# Patient Record
Sex: Male | Born: 1975 | Race: Black or African American | Hispanic: No | Marital: Single | Smoking: Current every day smoker
Health system: Southern US, Community
[De-identification: ages and names within clinical notes are randomized; demographics above are authoritative.]

---

## 2007-06-19 ENCOUNTER — Emergency Department: Payer: Self-pay | Admitting: Emergency Medicine

## 2007-06-23 ENCOUNTER — Emergency Department: Payer: Self-pay

## 2007-06-23 ENCOUNTER — Other Ambulatory Visit: Payer: Self-pay

## 2013-04-13 ENCOUNTER — Emergency Department: Payer: Self-pay | Admitting: Emergency Medicine

## 2013-04-13 LAB — URINALYSIS, COMPLETE
Bilirubin,UR: NEGATIVE
Glucose,UR: NEGATIVE mg/dL (ref 0–75)
Nitrite: NEGATIVE
Ph: 6 (ref 4.5–8.0)
Protein: 25
Specific Gravity: 1.005 (ref 1.003–1.030)
Squamous Epithelial: <1
WBC UR: 1 /HPF (ref 0–5)

## 2013-04-13 LAB — COMPREHENSIVE METABOLIC PANEL
Alkaline Phosphatase: 94 U/L (ref 50–136)
Calcium, Total: 10.1 mg/dL (ref 8.5–10.1)
Chloride: 102 mmol/L (ref 98–107)
Creatinine: 0.91 mg/dL (ref 0.60–1.30)
EGFR (Non-African Amer.): >60
Glucose: 88 mg/dL (ref 65–99)
Osmolality: 271 (ref 275–301)
Potassium: 3.5 mmol/L (ref 3.5–5.1)
SGOT(AST): 37 U/L (ref 15–37)

## 2013-04-13 LAB — CBC
HGB: 15.9 g/dL (ref 13.0–18.0)
MCH: 27.8 pg (ref 26.0–34.0)
MCHC: 33.5 g/dL (ref 32.0–36.0)
RBC: 5.73 10*6/uL (ref 4.40–5.90)
RDW: 14.4 % (ref 11.5–14.5)

## 2016-05-26 ENCOUNTER — Emergency Department
Admission: EM | Admit: 2016-05-26 | Discharge: 2016-05-26 | Disposition: A | Discharge status: Home / Self Care | Payer: Self-pay | Attending: Emergency Medicine | Admitting: Emergency Medicine

## 2016-05-26 ENCOUNTER — Encounter: Payer: Self-pay | Admitting: Emergency Medicine

## 2016-05-26 DIAGNOSIS — Z5321 Procedure and treatment not carried out due to patient leaving prior to being seen by health care provider: Secondary | ICD-10-CM | POA: Insufficient documentation

## 2016-05-26 DIAGNOSIS — R1310 Dysphagia, unspecified: Secondary | ICD-10-CM | POA: Insufficient documentation

## 2016-05-26 NOTE — ED Triage Notes (Signed)
Pt with difficulty swallowing liquids for three days, states liquids come back up. Ok to swallow solid foods. No resp distress noted.

## 2016-05-27 ENCOUNTER — Telehealth: Payer: Self-pay | Admitting: Emergency Medicine

## 2016-05-27 NOTE — Telephone Encounter (Signed)
Called patient due to lwot to inquire about condition and follow up plans. Patient says he does not have any stroke symptoms, but does have pain with swallowing and feels like throat is raw. He says it sometimes feels like acid coming up.  He has no pcp, but will be getting insurance in about 30 days. I advised him to obtain a pcp in his network and make his appointment.  I also advised him to ask a pharmacist about any otc medications they would recommend for his symptoms. I told him he could always return here if he desires or if worse.

## 2016-06-18 ENCOUNTER — Emergency Department
Admission: EM | Admit: 2016-06-18 | Discharge: 2016-06-18 | Disposition: A | Discharge status: Home / Self Care | Payer: Self-pay | Attending: Emergency Medicine | Admitting: Emergency Medicine

## 2016-06-18 ENCOUNTER — Encounter: Payer: Self-pay | Admitting: Emergency Medicine

## 2016-06-18 DIAGNOSIS — R1110 Vomiting, unspecified: Secondary | ICD-10-CM

## 2016-06-18 DIAGNOSIS — J029 Acute pharyngitis, unspecified: Secondary | ICD-10-CM | POA: Insufficient documentation

## 2016-06-18 DIAGNOSIS — F17200 Nicotine dependence, unspecified, uncomplicated: Secondary | ICD-10-CM | POA: Insufficient documentation

## 2016-06-18 LAB — CBC
HCT: 46.7 % (ref 40.0–52.0)
HEMOGLOBIN: 15.8 g/dL (ref 13.0–18.0)
MCH: 28.7 pg (ref 26.0–34.0)
MCHC: 33.8 g/dL (ref 32.0–36.0)
MCV: 84.9 fL (ref 80.0–100.0)
PLATELETS: 175 10*3/uL (ref 150–440)
RBC: 5.5 MIL/uL (ref 4.40–5.90)
RDW: 14.3 % (ref 11.5–14.5)
WBC: 11.4 10*3/uL — ABNORMAL HIGH (ref 3.8–10.6)

## 2016-06-18 LAB — POCT RAPID STREP A: STREPTOCOCCUS, GROUP A SCREEN (DIRECT): NEGATIVE

## 2016-06-18 LAB — COMPREHENSIVE METABOLIC PANEL
ALK PHOS: 64 U/L (ref 38–126)
ALT: 32 U/L (ref 17–63)
ANION GAP: 6 (ref 5–15)
AST: 32 U/L (ref 15–41)
Albumin: 4.5 g/dL (ref 3.5–5.0)
BILIRUBIN TOTAL: 0.6 mg/dL (ref 0.3–1.2)
BUN: 10 mg/dL (ref 6–20)
CALCIUM: 9.9 mg/dL (ref 8.9–10.3)
CO2: 26 mmol/L (ref 22–32)
Chloride: 110 mmol/L (ref 101–111)
Creatinine, Ser: 0.79 mg/dL (ref 0.61–1.24)
GFR calc non Af Amer: >60 mL/min (ref >60)
Glucose, Bld: 90 mg/dL (ref 65–99)
Potassium: 3.4 mmol/L — ABNORMAL LOW (ref 3.5–5.1)
Sodium: 142 mmol/L (ref 135–145)
TOTAL PROTEIN: 7.5 g/dL (ref 6.5–8.1)

## 2016-06-18 LAB — URINALYSIS COMPLETE WITH MICROSCOPIC (ARMC ONLY)
Bacteria, UA: NONE SEEN
Bilirubin Urine: NEGATIVE
GLUCOSE, UA: NEGATIVE mg/dL
KETONES UR: NEGATIVE mg/dL
Leukocytes, UA: NEGATIVE
NITRITE: NEGATIVE
Protein, ur: NEGATIVE mg/dL
SPECIFIC GRAVITY, URINE: 1.003 — AB (ref 1.005–1.030)
Squamous Epithelial / LPF: NONE SEEN
WBC UA: NONE SEEN WBC/hpf (ref 0–5)
pH: 8 (ref 5.0–8.0)

## 2016-06-18 LAB — LIPASE, BLOOD: Lipase: 24 U/L (ref 11–51)

## 2016-06-18 MED ORDER — DEXAMETHASONE 1 MG/ML PO CONC
ORAL | Status: AC
  Administered 2016-06-18: 10 mg via ORAL
  Filled 2016-06-18: qty 1

## 2016-06-18 MED ORDER — ONDANSETRON HCL 4 MG PO TABS: 4.0000 mg | ORAL_TABLET | Freq: Three times a day (TID) | ORAL | 0 refills | Status: DC | PRN

## 2016-06-18 MED ORDER — ONDANSETRON HCL 4 MG/2ML IJ SOLN
4.0000 mg | Freq: Once | INTRAMUSCULAR | Status: AC | PRN
  Administered 2016-06-18: 4 mg via INTRAVENOUS

## 2016-06-18 MED ORDER — ONDANSETRON HCL 4 MG/2ML IJ SOLN
INTRAMUSCULAR | Status: AC
  Administered 2016-06-18: 4 mg via INTRAVENOUS
  Filled 2016-06-18: qty 2

## 2016-06-18 MED ORDER — DEXAMETHASONE 1 MG/ML PO CONC
10.0000 mg | Freq: Once | ORAL | Status: AC
  Administered 2016-06-18: 10 mg via ORAL

## 2016-06-18 NOTE — Discharge Instructions (Signed)
You were evaluated for sore throat as well as nausea and vomiting, and although no certain cause was found, I suspect may be a virus causing your symptoms. You are given a dose of Decadron to help with inflammation and symptoms.  We discussed holding off on any CT scans at this point in time, but you do need to return to the emergency department at any point in time if you develop any trouble breathing, trouble swallowing, fever, vomiting blood, or any other symptoms concerning to you.  You are referred [facility] or [facility] for [facility] care.

## 2016-06-18 NOTE — ED Notes (Signed)

## 2016-06-18 NOTE — ED Triage Notes (Signed)
Pt here for throat pain on and off x3 weeks, vomiting ( activity vomiting) pt diaphoretic

## 2016-06-18 NOTE — ED Provider Notes (Signed)
[facility] Emergency Department Provider Note ____________________________________________   I have reviewed the triage vital signs and the triage nursing note.  HISTORY  Chief Complaint Sore Throat and Emesis   Historian Patient  HPI Bobby Walker is a 40 y.o. male who is here for evaluation of sore throat for about 5 days now. He states it was mild and now is worse. He states that occasionally he feels like he is having trouble breathing or trouble swallowing, but no fever, vomiting blood although he has had some nausea and vomiting.No chest pain.  No abdominal pain or diarrhea. He states that when he looked at his tongue he saw bumps back there.  One week ago he was treated with pills for Trichomonas found it sounds like before he was going to donate blood. He doesn't feel like he was having any symptoms before that and doesn't feel like this is a side effect of the antibiotics.    History reviewed. No pertinent past medical history.  There are no active problems to display for this patient.   History reviewed. No pertinent surgical history.  Prior to Admission medications   Medication Sig Start Date End Date Taking? Authorizing Provider  ondansetron (ZOFRAN) 4 MG tablet Take 1 tablet (4 mg total) by mouth every 8 (eight) hours as needed for nausea or vomiting. 06/18/16   Governor Rooks, MD    No Known Allergies  No family history on file.  Social History Social History  Substance Use Topics  . Smoking status: Current Every Day Smoker  . Smokeless tobacco: Never Used  . Alcohol use Not on file    Review of Systems  Constitutional: Negative for fever. Eyes: Negative for visual changes. ENT: Positive for sore throat. Cardiovascular: Negative for chest pain. Respiratory: Negative for productive cough. Gastrointestinal: Negative for abdominal pain, vomiting and diarrhea. Genitourinary: Negative for dysuria. Musculoskeletal: Negative for back  pain. Skin: Negative for rash. Neurological: Negative for headache. 10 point Review of Systems otherwise negative ____________________________________________   PHYSICAL EXAM:  VITAL SIGNS: ED Triage Vitals [06/18/16 0835]  Enc Vitals Group     BP 120/66     Pulse Rate 63     Resp 20     Temp 97.7 F (36.5 C)     Temp Source Oral     SpO2 98 %     Weight 155 lb (70.3 kg)     Height 5\' 1"  (1.549 m)     Head Circumference      Peak Flow      Pain Score 9     Pain Loc      Pain Edu?      Excl. in GC?      Constitutional: Alert and oriented. Well appearing and in no distress. HEENT   Head: Normocephalic and atraumatic.      Eyes: Conjunctivae are normal. PERRL. Normal extraocular movements.      Ears:         Nose: No congestion/rhinnorhea.   Mouth/Throat: Mucous membranes are moist.  Mild pharyngeal erythema without any exudates. No uvular swelling. No palatal petechiae.   Neck: No stridor.  No enlarged lymph nodes. No visible swelling. No stridor. Cardiovascular/Chest: Normal rate, regular rhythm.  No murmurs, rubs, or gallops. Respiratory: Normal respiratory effort without tachypnea nor retractions. Breath sounds are clear and equal bilaterally. No wheezes/rales/rhonchi. Gastrointestinal: Soft. No distention, no guarding, no rebound. Nontender.    Genitourinary/rectal:Deferred Musculoskeletal: Nontender with normal range of motion in all  extremities. No joint effusions.  No lower extremity tenderness.  No edema. Neurologic:  Normal speech and language. No gross or focal neurologic deficits are appreciated. Skin:  Skin is warm, dry and intact. No rash noted. Psychiatric: Mood and affect are normal. Speech and behavior are normal. Patient exhibits appropriate insight and judgment.   ____________________________________________  LABS (pertinent positives/negatives)  Labs Reviewed  COMPREHENSIVE METABOLIC PANEL - Abnormal; Notable for the following:        Result Value   Potassium 3.4 (*)    All other components within normal limits  CBC - Abnormal; Notable for the following:    WBC 11.4 (*)    All other components within normal limits  URINALYSIS COMPLETEWITH MICROSCOPIC ([facility] ONLY) - Abnormal; Notable for the following:    Color, Urine STRAW (*)    APPearance CLEAR (*)    Specific Gravity, Urine 1.003 (*)    Hgb urine dipstick 1+ (*)    All other components within normal limits  CULTURE, GROUP A STREP ([facility])  LIPASE, BLOOD  POCT RAPID STREP A    ____________________________________________    EKG I, Governor Rooksebecca Vicente Weidler, MD, the attending physician have personally viewed and interpreted all ECGs.  59 bpm. Normal sinus rhythm. Narrow QRS. Nonspecific ST and T-wave.  Looks fairly similar to 2008 ____________________________________________  RADIOLOGY All Xrays were viewed by me. Imaging interpreted by Radiologist.  None __________________________________________  PROCEDURES  Procedure(s) performed: None  Critical Care performed: None  ____________________________________________   ED COURSE / ASSESSMENT AND PLAN  Pertinent labs & imaging results that were available during my care of the patient were reviewed by me and considered in my medical decision making (see chart for details).   Bobby Walker is here for sore throat and stating that he has bumps on his tongue near the back. When looking at his oropharynx, there is minimal erythema, and the bumps that he is talking about appeared to be the posterior taste buds.  No petechiae or vesicles. Normal airway on exam, no wheezing or trouble breathing or stridor. Neck is soft and nontender without adenopathy. At this point in time I don't have a high suspicion for abscess or epiglottitis or mass or Ludwig's angina. His main complaint is the sore throat, and I think it may be viral pharyngitis and we discussed the child Decadron. I also discussed with him whether or not to CT the  neck, but given reassuring exam and evaluation, we chose to hold off on this right now which I think is totally reasonable.   Nausea and vomiting sound like it might be related to swallowing which is painful, but there is no abdominal pain on exam and I don't see obvious indication of an intra-abdominal emergency. I am going to prescribe a couple of Zofran tablets. Most importantly, I want him to be closely followed, and referred to primary care for follow-up, and we discussed return precautions for the emergency department.   CONSULTATIONS:   None   Patient / Family / Caregiver informed of clinical course, medical decision-making process, and agree with plan.   I discussed return precautions, follow-up instructions, and discharge instructions with patient and/or family.   ___________________________________________   FINAL CLINICAL IMPRESSION(S) / ED DIAGNOSES   Final diagnoses:  Non-intractable vomiting, presence of nausea not specified, unspecified vomiting type  Sore throat              Note: This dictation was prepared with Dragon dictation. Any transcriptional errors that result from this process  are unintentional    Governor Rooksebecca Milly Goggins, MD 06/18/16 [phone]

## 2016-06-21 LAB — CULTURE, GROUP A STREP (THRC)

## 2018-04-28 ENCOUNTER — Emergency Department (HOSPITAL_COMMUNITY)
Admission: EM | Admit: 2018-04-28 | Discharge: 2018-04-28 | Disposition: A | Discharge status: Home / Self Care | Payer: PRIVATE HEALTH INSURANCE | Attending: Emergency Medicine | Admitting: Emergency Medicine

## 2018-04-28 ENCOUNTER — Emergency Department (HOSPITAL_COMMUNITY): Payer: PRIVATE HEALTH INSURANCE

## 2018-04-28 ENCOUNTER — Other Ambulatory Visit: Payer: Self-pay

## 2018-04-28 ENCOUNTER — Encounter (HOSPITAL_COMMUNITY): Payer: Self-pay | Admitting: Emergency Medicine

## 2018-04-28 DIAGNOSIS — Z79899 Other long term (current) drug therapy: Secondary | ICD-10-CM | POA: Diagnosis not present

## 2018-04-28 DIAGNOSIS — R079 Chest pain, unspecified: Secondary | ICD-10-CM | POA: Insufficient documentation

## 2018-04-28 DIAGNOSIS — F17200 Nicotine dependence, unspecified, uncomplicated: Secondary | ICD-10-CM | POA: Diagnosis not present

## 2018-04-28 LAB — COMPREHENSIVE METABOLIC PANEL
ALBUMIN: 4.9 g/dL (ref 3.5–5.0)
ALT: 42 U/L (ref 0–44)
ANION GAP: 17 — AB (ref 5–15)
AST: 82 U/L — ABNORMAL HIGH (ref 15–41)
Alkaline Phosphatase: 65 U/L (ref 38–126)
BILIRUBIN TOTAL: 2 mg/dL — AB (ref 0.3–1.2)
BUN: 28 mg/dL — ABNORMAL HIGH (ref 6–20)
CHLORIDE: 100 mmol/L (ref 98–111)
CO2: 17 mmol/L — AB (ref 22–32)
Calcium: 10.1 mg/dL (ref 8.9–10.3)
Creatinine, Ser: 1.37 mg/dL — ABNORMAL HIGH (ref 0.61–1.24)
GFR calc Af Amer: >60 mL/min (ref >60)
GFR calc non Af Amer: >60 mL/min (ref >60)
GLUCOSE: 93 mg/dL (ref 70–99)
POTASSIUM: 3.9 mmol/L (ref 3.5–5.1)
SODIUM: 134 mmol/L — AB (ref 135–145)
TOTAL PROTEIN: 7.6 g/dL (ref 6.5–8.1)

## 2018-04-28 LAB — CBC WITH DIFFERENTIAL/PLATELET
Abs Immature Granulocytes: 0.1 10*3/uL (ref 0.0–0.1)
BASOS ABS: 0.1 10*3/uL (ref 0.0–0.1)
BASOS PCT: 0 %
EOS ABS: 0.1 10*3/uL (ref 0.0–0.7)
EOS PCT: 1 %
HCT: 44.2 % (ref 39.0–52.0)
Hemoglobin: 15.3 g/dL (ref 13.0–17.0)
Immature Granulocytes: 0 %
Lymphocytes Relative: 23 %
Lymphs Abs: 3.3 10*3/uL (ref 0.7–4.0)
MCH: 28.6 pg (ref 26.0–34.0)
MCHC: 34.6 g/dL (ref 30.0–36.0)
MCV: 82.6 fL (ref 78.0–100.0)
Monocytes Absolute: 1.5 10*3/uL — ABNORMAL HIGH (ref 0.1–1.0)
Monocytes Relative: 11 %
NEUTROS PCT: 65 %
Neutro Abs: 9.1 10*3/uL — ABNORMAL HIGH (ref 1.7–7.7)
PLATELETS: 163 10*3/uL (ref 150–400)
RBC: 5.35 MIL/uL (ref 4.22–5.81)
RDW: 13.2 % (ref 11.5–15.5)
WBC: 14.1 10*3/uL — AB (ref 4.0–10.5)

## 2018-04-28 LAB — LIPASE, BLOOD: LIPASE: 26 U/L (ref 11–51)

## 2018-04-28 LAB — I-STAT TROPONIN, ED
TROPONIN I, POC: 0.01 ng/mL (ref 0.00–0.08)
Troponin i, poc: 0 ng/mL (ref 0.00–0.08)

## 2018-04-28 LAB — D-DIMER, QUANTITATIVE (NOT AT ARMC): D DIMER QUANT: 0.29 ug{FEU}/mL (ref 0.00–0.50)

## 2018-04-28 LAB — CBG MONITORING, ED: Glucose-Capillary: 92 mg/dL (ref 70–99)

## 2018-04-28 MED ORDER — SODIUM CHLORIDE 0.9 % IV BOLUS
1000.0000 mL | Freq: Once | INTRAVENOUS | Status: AC
  Administered 2018-04-28: 1000 mL via INTRAVENOUS

## 2018-04-28 MED ORDER — ONDANSETRON HCL 4 MG/2ML IJ SOLN
4.0000 mg | Freq: Once | INTRAMUSCULAR | Status: AC
  Administered 2018-04-28: 4 mg via INTRAVENOUS
  Filled 2018-04-28: qty 2

## 2018-04-28 MED ORDER — ONDANSETRON HCL 4 MG PO TABS: 4.0000 mg | ORAL_TABLET | Freq: Four times a day (QID) | ORAL | 0 refills | Status: DC

## 2018-04-28 MED ORDER — LORAZEPAM 2 MG/ML IJ SOLN
0.5000 mg | Freq: Once | INTRAMUSCULAR | Status: AC
  Administered 2018-04-28: 0.5 mg via INTRAVENOUS
  Filled 2018-04-28: qty 1

## 2018-04-28 MED ORDER — FENTANYL CITRATE (PF) 100 MCG/2ML IJ SOLN
50.0000 ug | Freq: Once | INTRAMUSCULAR | Status: AC
  Administered 2018-04-28: 50 ug via INTRAVENOUS
  Filled 2018-04-28: qty 2

## 2018-04-28 NOTE — ED Triage Notes (Signed)
Patient present to the ED with complaints of CP since last night with a syncope espisode. Per EMS patient was hypotensive BP 90 sistolic. And diaphoresis . Patient alert and orinted x4 on arrival. EMS gave 324 ASA 4mg  zofran.

## 2018-04-28 NOTE — ED Notes (Signed)
EKG contained a significant amount of artifact due to patient shaking and not being able to sit still. Will repeat EKG once patient has calmed down.

## 2018-04-28 NOTE — Discharge Instructions (Addendum)
Tests showed no evidence of a heart attack.  Prescription for nausea medicine.  Increase fluids.  Rest.

## 2018-04-28 NOTE — ED Provider Notes (Signed)
[facility] EMERGENCY DEPARTMENT Provider Note   CSN: [id number] Arrival date & time: 04/28/18  [id number]     History   Chief Complaint Chief Complaint  Patient presents with  . Chest Pain    HPI Bobby Walker is a 42 y.o. male.  Squeezing chest pain earlier today since 5:30 PM with associated diaphoresis and nausea.  No dyspnea.  No prior history of CAD.  No diabetes or hypertension.  He quit smoking 3 months ago.  No chronic health problems.  No prolonged travel or immobilization.  Questionable syncopal episode last evening.     History reviewed. No pertinent past medical history.  There are no active problems to display for this patient.   History reviewed. No pertinent surgical history.      Home Medications    Prior to Admission medications   Medication Sig Start Date End Date Taking? Authorizing Provider  Vitamin D, Ergocalciferol, (DRISDOL) 50000 units CAPS capsule Take 1 capsule by mouth every Friday. 04/08/18  Yes [provider]  ondansetron (ZOFRAN) 4 MG tablet Take 1 tablet (4 mg total) by mouth every 8 (eight) hours as needed for nausea or vomiting. Patient not taking: Reported on 04/28/2018 06/18/16   Governor RooksLord, Rebecca, MD  ondansetron (ZOFRAN) 4 MG tablet Take 1 tablet (4 mg total) by mouth every 6 (six) hours. 04/28/18   Donnetta Hutchingook, Ota Ebersole, MD    Family History No family history on file.  Social History Social History   Tobacco Use  . Smoking status: Current Every Day Smoker  . Smokeless tobacco: Never Used  Substance Use Topics  . Alcohol use: Not on file  . Drug use: Not on file     Allergies   Patient has no known allergies.   Review of Systems Review of Systems  All other systems reviewed and are negative.    Physical Exam Updated Vital Signs BP (!) 104/41   Pulse 65   Temp 98.6 F (37 C) (Oral)   Resp 12   Ht 5\' 1"  (1.549 m)   Wt 69.4 kg   SpO2 100%   BMI 28.91 kg/m   Physical Exam  Constitutional: He is  oriented to person, place, and time. He appears well-developed and well-nourished.  HENT:  Head: Normocephalic and atraumatic.  Eyes: Conjunctivae are normal.  Neck: Neck supple.  Cardiovascular: Normal rate and regular rhythm.  Pulmonary/Chest: Effort normal and breath sounds normal.  Abdominal: Soft. Bowel sounds are normal.  Musculoskeletal: Normal range of motion.  Neurological: He is alert and oriented to person, place, and time.  Skin: Skin is warm and dry.  Psychiatric: He has a normal mood and affect. His behavior is normal.  Nursing note and vitals reviewed.    ED Treatments / Results  Labs (all labs ordered are listed, but only abnormal results are displayed) Labs Reviewed  CBC WITH DIFFERENTIAL/PLATELET - Abnormal; Notable for the following components:      Result Value   WBC 14.1 (*)    Neutro Abs 9.1 (*)    Monocytes Absolute 1.5 (*)    All other components within normal limits  COMPREHENSIVE METABOLIC PANEL - Abnormal; Notable for the following components:   Sodium 134 (*)    CO2 17 (*)    BUN 28 (*)    Creatinine, Ser 1.37 (*)    AST 82 (*)    Total Bilirubin 2.0 (*)    Anion gap 17 (*)    All other components within normal limits  D-DIMER, QUANTITATIVE (NOT AT [facility])  LIPASE, BLOOD  I-STAT TROPONIN, ED  CBG MONITORING, ED  I-STAT TROPONIN, ED    EKG EKG Interpretation  Date/Time:  Thursday April 28 2018 09:20:22 EDT Ventricular Rate:  64 PR Interval:    QRS Duration: 102 QT Interval:  421 QTC Calculation: 435 R Axis:   88 Text Interpretation:  Normal sinus rhythm Probable left ventricular hypertrophy ST elevation, consider anterior injury Artifact in lead(s) I II III aVR aVL V1 V2 Reconfirmed by Donnetta Hutching ([id number]) on 04/28/2018 2:04:23 PM   Radiology Dg Chest Port 1 View  Result Date: 04/28/2018 CLINICAL DATA:  Chest pain EXAM: PORTABLE CHEST 1 VIEW COMPARISON:  None. FINDINGS: Top-normal heart size. Normal mediastinal contour. No  pneumothorax. No pleural effusion. Lungs appear clear, with no acute consolidative airspace disease and no pulmonary edema. IMPRESSION: No active disease. Electronically Signed   By: Delbert Phenix M.D.   On: 04/28/2018 09:53    Procedures Procedures (including critical care time)  Medications Ordered in ED Medications  sodium chloride 0.9 % bolus 1,000 mL (0 mLs Intravenous Stopped 04/28/18 1239)  ondansetron (ZOFRAN) injection 4 mg (4 mg Intravenous Given 04/28/18 0932)  fentaNYL (SUBLIMAZE) injection 50 mcg (50 mcg Intravenous Given 04/28/18 0933)  LORazepam (ATIVAN) injection 0.5 mg (0.5 mg Intravenous Given 04/28/18 0934)  sodium chloride 0.9 % bolus 1,000 mL (0 mLs Intravenous Stopped 04/28/18 1402)     Initial Impression / Assessment and Plan / ED Course  I have reviewed the triage vital signs and the nursing notes.  Pertinent labs & imaging results that were available during my care of the patient were reviewed by me and considered in my medical decision making (see chart for details).    Patient presents with chest pain.  He is low risk for ACS or PE.  Screening tests including EKG, chest x-ray, delta troponin, d-dimer all negative.  Patient observed for several hours.  He was hemodynamically stable at discharge.  Discussed test results with patient and his wife.  Final Clinical Impressions(s) / ED Diagnoses   Final diagnoses:  Chest pain, unspecified type    ED Discharge Orders         Ordered    ondansetron (ZOFRAN) 4 MG tablet  Every 6 hours     04/28/18 1355           Donnetta Hutching, MD 04/28/18 1409

## 2018-09-23 ENCOUNTER — Emergency Department: Payer: PRIVATE HEALTH INSURANCE

## 2018-09-23 ENCOUNTER — Encounter: Payer: Self-pay | Admitting: Medical Oncology

## 2018-09-23 ENCOUNTER — Emergency Department
Admission: EM | Admit: 2018-09-23 | Discharge: 2018-09-23 | Disposition: A | Discharge status: Home / Self Care | Payer: PRIVATE HEALTH INSURANCE | Attending: Emergency Medicine | Admitting: Emergency Medicine

## 2018-09-23 DIAGNOSIS — M25531 Pain in right wrist: Secondary | ICD-10-CM | POA: Diagnosis not present

## 2018-09-23 DIAGNOSIS — F17210 Nicotine dependence, cigarettes, uncomplicated: Secondary | ICD-10-CM | POA: Insufficient documentation

## 2018-09-23 MED ORDER — IBUPROFEN 600 MG PO TABS: 600.0000 mg | ORAL_TABLET | Freq: Three times a day (TID) | ORAL | 0 refills | Status: DC | PRN

## 2018-09-23 MED ORDER — OXYCODONE-ACETAMINOPHEN 5-325 MG PO TABS
1.0000 | ORAL_TABLET | Freq: Once | ORAL | Status: AC
  Administered 2018-09-23: 1 via ORAL
  Filled 2018-09-23: qty 1

## 2018-09-23 MED ORDER — IBUPROFEN 600 MG PO TABS
600.0000 mg | ORAL_TABLET | Freq: Once | ORAL | Status: AC
  Administered 2018-09-23: 600 mg via ORAL
  Filled 2018-09-23: qty 1

## 2018-09-23 MED ORDER — TRAMADOL HCL 50 MG PO TABS: 50.0000 mg | ORAL_TABLET | Freq: Four times a day (QID) | ORAL | 0 refills | Status: DC | PRN

## 2018-09-23 NOTE — ED Notes (Signed)
Pt states that while moving a refridgerator by himself, it fell off the dolly and pinned his right arm in between a wall. Pt was stuck there for 45 minutes until help could come and get the fridge off of him.

## 2018-09-23 NOTE — ED Triage Notes (Signed)
Pt reports he was moving a refrigerator yesterday and his rt lower arm got pinned between door and fridge.

## 2018-09-23 NOTE — Discharge Instructions (Addendum)
Wear splint and sling for 2 to 3 days as needed.

## 2018-09-23 NOTE — ED Provider Notes (Signed)
[facility] Emergency Department Provider Note   ____________________________________________   First MD Initiated Contact with Patient 09/23/18 [phone]     (approximate)  I have reviewed the triage vital signs and the nursing notes.   HISTORY  Chief Complaint Arm Pain    HPI Bobby Walker is a 43 y.o. male right wrist and forearm pain secondary to contusion.  Patient was moving a refrigerator yesterday when his arm got caught between the door and the refrigerator.  Patient states swelling has increased along with the pain overnight.  Patient rates his pain as a 9/10.  Patient described the pain is "aching".  Patient states decreased range of motion with flexion extension of the wrist.  Patient denies loss of sensation.  No palliative measure for complaint.  History reviewed. No pertinent past medical history.  There are no active problems to display for this patient.   No past surgical history on file.  Prior to Admission medications   Medication Sig Start Date End Date Taking? Authorizing Provider  ibuprofen (ADVIL,MOTRIN) 600 MG tablet Take 1 tablet (600 mg total) by mouth every 8 (eight) hours as needed. 09/23/18   Joni Reining, PA-C  ondansetron (ZOFRAN) 4 MG tablet Take 1 tablet (4 mg total) by mouth every 8 (eight) hours as needed for nausea or vomiting. Patient not taking: Reported on 04/28/2018 06/18/16   Governor Rooks, MD  ondansetron (ZOFRAN) 4 MG tablet Take 1 tablet (4 mg total) by mouth every 6 (six) hours. 04/28/18   Donnetta Hutching, MD  traMADol (ULTRAM) 50 MG tablet Take 1 tablet (50 mg total) by mouth every 6 (six) hours as needed for moderate pain. 09/23/18   Joni Reining, PA-C  Vitamin D, Ergocalciferol, (DRISDOL) 50000 units CAPS capsule Take 1 capsule by mouth every Friday. 04/08/18   [provider]    Allergies Patient has no known allergies.  No family history on file.  Social History Social History   Tobacco Use  .  Smoking status: Current Every Day Smoker  . Smokeless tobacco: Never Used  Substance Use Topics  . Alcohol use: Not on file  . Drug use: Not on file    Review of Systems Constitutional: No fever/chills Eyes: No visual changes. ENT: No sore throat. Cardiovascular: Denies chest pain. Respiratory: Denies shortness of breath. Gastrointestinal: No abdominal pain.  No nausea, no vomiting.  No diarrhea.  No constipation. Genitourinary: Negative for dysuria. Musculoskeletal: Right wrist and forearm pain. Skin: Negative for rash. Neurological: Negative for headaches, focal weakness or numbness.   ____________________________________________   PHYSICAL EXAM:  VITAL SIGNS: ED Triage Vitals [09/23/18 0909]  Enc Vitals Group     BP      Pulse      Resp      Temp      Temp src      SpO2      Weight 152 lb 1.9 oz (69 kg)     Height      Head Circumference      Peak Flow      Pain Score 9     Pain Loc      Pain Edu?      Excl. in GC?    Constitutional: Alert and oriented. Well appearing and in no acute distress. Cardiovascular: Normal rate, regular rhythm. Grossly normal heart sounds.  Good peripheral circulation. Respiratory: Normal respiratory effort.  No retractions. Lungs CTAB. Musculoskeletal: No obvious deformity to the right wrist and forearm.  Patient has decreased range of motion with flexion extension of the wrist.  Patient also moderate guarding midshaft of the radius.  Neurologic:  Normal speech and language. No gross focal neurologic deficits are appreciated. No gait instability. Skin:  Skin is warm, dry and intact. No rash noted. Psychiatric: Mood and affect are normal. Speech and behavior are normal.  ____________________________________________   LABS (all labs ordered are listed, but only abnormal results are displayed)  Labs Reviewed - No data to  display ____________________________________________  EKG   ____________________________________________  RADIOLOGY  ED MD interpretation:    Official radiology report(s): No results found.  ____________________________________________   PROCEDURES  Procedure(s) performed: None  .Splint Application Date/Time: 09/23/2018 10:16 AM Performed by: Joni Reining, PA-C Authorized by: Joni Reining, PA-C   Consent:    Consent obtained:  Verbal   Consent given by:  Patient   Risks discussed:  Numbness, pain and swelling Pre-procedure details:    Sensation:  Normal Procedure details:    Laterality:  Right   Location:  Wrist   Wrist:  R wrist   Splint type:  Wrist   Supplies:  Prefabricated splint Post-procedure details:    Pain:  Unchanged   Sensation:  Normal   Patient tolerance of procedure:  Tolerated well, no immediate complications    Critical Care performed: No  ____________________________________________   INITIAL IMPRESSION / ASSESSMENT AND PLAN / ED COURSE  As part of my medical decision making, I reviewed the following data within the electronic MEDICAL RECORD NUMBER     Right wrist and forearm pain secondary to contusion.  Discussed negative x-ray findings with patient.  Patient given discharge care instruction.  Patient placed in a arm splint and sling.  Advised to follow [facility] condition persist.      ____________________________________________   FINAL CLINICAL IMPRESSION(S) / ED DIAGNOSES  Final diagnoses:  Pain in wrist, right     ED Discharge Orders         Ordered    traMADol (ULTRAM) 50 MG tablet  Every 6 hours PRN     09/23/18 1010    ibuprofen (ADVIL,MOTRIN) 600 MG tablet  Every 8 hours PRN     09/23/18 1010           Note:  This document was prepared using Dragon voice recognition software and may include unintentional dictation errors.    Joni Reining, PA-C 09/23/18 1017    Schaevitz, Myra Rude,  MD 09/23/18 [phone]

## 2019-01-05 ENCOUNTER — Emergency Department (HOSPITAL_COMMUNITY): Payer: No Typology Code available for payment source

## 2019-01-05 ENCOUNTER — Emergency Department (HOSPITAL_COMMUNITY)
Admission: EM | Admit: 2019-01-05 | Discharge: 2019-01-05 | Disposition: A | Discharge status: Home / Self Care | Payer: No Typology Code available for payment source | Attending: Emergency Medicine | Admitting: Emergency Medicine

## 2019-01-05 DIAGNOSIS — Y999 Unspecified external cause status: Secondary | ICD-10-CM | POA: Insufficient documentation

## 2019-01-05 DIAGNOSIS — R079 Chest pain, unspecified: Secondary | ICD-10-CM

## 2019-01-05 DIAGNOSIS — Y929 Unspecified place or not applicable: Secondary | ICD-10-CM | POA: Insufficient documentation

## 2019-01-05 DIAGNOSIS — R10813 Right lower quadrant abdominal tenderness: Secondary | ICD-10-CM | POA: Insufficient documentation

## 2019-01-05 DIAGNOSIS — R10811 Right upper quadrant abdominal tenderness: Secondary | ICD-10-CM | POA: Insufficient documentation

## 2019-01-05 DIAGNOSIS — W57XXXA Bitten or stung by nonvenomous insect and other nonvenomous arthropods, initial encounter: Secondary | ICD-10-CM | POA: Insufficient documentation

## 2019-01-05 DIAGNOSIS — M25511 Pain in right shoulder: Secondary | ICD-10-CM | POA: Insufficient documentation

## 2019-01-05 DIAGNOSIS — S20369A Insect bite (nonvenomous) of unspecified front wall of thorax, initial encounter: Secondary | ICD-10-CM | POA: Insufficient documentation

## 2019-01-05 DIAGNOSIS — R0789 Other chest pain: Secondary | ICD-10-CM | POA: Insufficient documentation

## 2019-01-05 DIAGNOSIS — R1033 Periumbilical pain: Secondary | ICD-10-CM | POA: Insufficient documentation

## 2019-01-05 DIAGNOSIS — Y939 Activity, unspecified: Secondary | ICD-10-CM | POA: Insufficient documentation

## 2019-01-05 DIAGNOSIS — R4182 Altered mental status, unspecified: Secondary | ICD-10-CM | POA: Insufficient documentation

## 2019-01-05 LAB — COMPREHENSIVE METABOLIC PANEL
ALT: 30 U/L (ref 0–44)
AST: 50 U/L — ABNORMAL HIGH (ref 15–41)
Albumin: 4.8 g/dL (ref 3.5–5.0)
Alkaline Phosphatase: 63 U/L (ref 38–126)
Anion gap: 13 (ref 5–15)
BUN: 15 mg/dL (ref 6–20)
CO2: 21 mmol/L — ABNORMAL LOW (ref 22–32)
Calcium: 10.3 mg/dL (ref 8.9–10.3)
Chloride: 107 mmol/L (ref 98–111)
Creatinine, Ser: 1.04 mg/dL (ref 0.61–1.24)
GFR calc Af Amer: >60 mL/min (ref >60)
GFR calc non Af Amer: >60 mL/min (ref >60)
Glucose, Bld: 91 mg/dL (ref 70–99)
Potassium: 4.5 mmol/L (ref 3.5–5.1)
Sodium: 141 mmol/L (ref 135–145)
Total Bilirubin: 1.5 mg/dL — ABNORMAL HIGH (ref 0.3–1.2)
Total Protein: 7.2 g/dL (ref 6.5–8.1)

## 2019-01-05 LAB — TYPE AND SCREEN
ABO/RH(D): O POS
Antibody Screen: NEGATIVE

## 2019-01-05 LAB — CBC WITH DIFFERENTIAL/PLATELET
Abs Immature Granulocytes: 0.05 10*3/uL (ref 0.00–0.07)
Basophils Absolute: 0.1 10*3/uL (ref 0.0–0.1)
Basophils Relative: 1 %
Eosinophils Absolute: 0.2 10*3/uL (ref 0.0–0.5)
Eosinophils Relative: 1 %
HCT: 44.1 % (ref 39.0–52.0)
Hemoglobin: 14.8 g/dL (ref 13.0–17.0)
Immature Granulocytes: 0 %
Lymphocytes Relative: 27 %
Lymphs Abs: 3.4 10*3/uL (ref 0.7–4.0)
MCH: 28.4 pg (ref 26.0–34.0)
MCHC: 33.6 g/dL (ref 30.0–36.0)
MCV: 84.6 fL (ref 80.0–100.0)
Monocytes Absolute: 1.2 10*3/uL — ABNORMAL HIGH (ref 0.1–1.0)
Monocytes Relative: 10 %
Neutro Abs: 7.7 10*3/uL (ref 1.7–7.7)
Neutrophils Relative %: 61 %
Platelets: 211 10*3/uL (ref 150–400)
RBC: 5.21 MIL/uL (ref 4.22–5.81)
RDW: 14.4 % (ref 11.5–15.5)
WBC: 12.6 10*3/uL — ABNORMAL HIGH (ref 4.0–10.5)
nRBC: 0 % (ref 0.0–0.2)

## 2019-01-05 LAB — LIPASE, BLOOD: Lipase: 25 U/L (ref 11–51)

## 2019-01-05 LAB — I-STAT CREATININE, ED: Creatinine, Ser: 0.9 mg/dL (ref 0.61–1.24)

## 2019-01-05 LAB — ABO/RH: ABO/RH(D): O POS

## 2019-01-05 LAB — ETHANOL: Alcohol, Ethyl (B): <10 mg/dL (ref <10)

## 2019-01-05 MED ORDER — IOHEXOL 300 MG/ML  SOLN
100.0000 mL | Freq: Once | INTRAMUSCULAR | Status: AC | PRN
  Administered 2019-01-05: 20:00:00 100 mL via INTRAVENOUS

## 2019-01-05 MED ORDER — MORPHINE SULFATE (PF) 4 MG/ML IV SOLN
4.0000 mg | Freq: Once | INTRAVENOUS | Status: AC
  Administered 2019-01-05: 4 mg via INTRAVENOUS
  Filled 2019-01-05: qty 1

## 2019-01-05 MED ORDER — KETOROLAC TROMETHAMINE 30 MG/ML IJ SOLN
30.0000 mg | Freq: Once | INTRAMUSCULAR | Status: AC
  Administered 2019-01-05: 22:00:00 30 mg via INTRAVENOUS
  Filled 2019-01-05: qty 1

## 2019-01-05 MED ORDER — IBUPROFEN 800 MG PO TABS: 800.0000 mg | ORAL_TABLET | Freq: Three times a day (TID) | ORAL | 0 refills | Status: DC | PRN

## 2019-01-05 MED ORDER — MORPHINE SULFATE (PF) 4 MG/ML IV SOLN: 4.0000 mg | Freq: Once | INTRAVENOUS | Status: DC

## 2019-01-05 NOTE — ED Notes (Signed)
Notified edp of pt reporting for severe pain in right shoulder.

## 2019-01-05 NOTE — ED Notes (Signed)
Patient verbalizes understanding of discharge instructions. Opportunity for questioning and answers were provided. Armband removed by staff, pt discharged from ED.  
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
## 2019-01-05 NOTE — ED Notes (Signed)
Nurse navigator spoke with patient who has phone however phone is dead. States does not remember phone numbers. Patient given charge to charge phone and will attempt to call wife shortly.

## 2019-01-05 NOTE — ED Notes (Signed)
Collar removed per EDP

## 2019-01-05 NOTE — ED Notes (Signed)
Pt on the phone with his wife now.
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
## 2019-07-03 ENCOUNTER — Encounter: Payer: Self-pay | Admitting: Emergency Medicine

## 2019-07-03 ENCOUNTER — Emergency Department: Payer: No Typology Code available for payment source

## 2019-07-03 ENCOUNTER — Emergency Department
Admission: EM | Admit: 2019-07-03 | Discharge: 2019-07-03 | Disposition: A | Discharge status: Home / Self Care | Payer: No Typology Code available for payment source | Attending: Emergency Medicine | Admitting: Emergency Medicine

## 2019-07-03 ENCOUNTER — Other Ambulatory Visit: Payer: Self-pay

## 2019-07-03 DIAGNOSIS — F17200 Nicotine dependence, unspecified, uncomplicated: Secondary | ICD-10-CM | POA: Insufficient documentation

## 2019-07-03 DIAGNOSIS — S39012A Strain of muscle, fascia and tendon of lower back, initial encounter: Secondary | ICD-10-CM | POA: Diagnosis not present

## 2019-07-03 DIAGNOSIS — Y999 Unspecified external cause status: Secondary | ICD-10-CM | POA: Insufficient documentation

## 2019-07-03 DIAGNOSIS — S3992XA Unspecified injury of lower back, initial encounter: Secondary | ICD-10-CM | POA: Diagnosis present

## 2019-07-03 DIAGNOSIS — G44311 Acute post-traumatic headache, intractable: Secondary | ICD-10-CM | POA: Insufficient documentation

## 2019-07-03 DIAGNOSIS — Y92410 Unspecified street and highway as the place of occurrence of the external cause: Secondary | ICD-10-CM | POA: Insufficient documentation

## 2019-07-03 DIAGNOSIS — Y939 Activity, unspecified: Secondary | ICD-10-CM | POA: Diagnosis not present

## 2019-07-03 MED ORDER — TRAMADOL HCL 50 MG PO TABS: 50.0000 mg | ORAL_TABLET | Freq: Four times a day (QID) | ORAL | 0 refills | Status: AC | PRN

## 2019-07-03 MED ORDER — CYCLOBENZAPRINE HCL 10 MG PO TABS: 10.0000 mg | ORAL_TABLET | Freq: Three times a day (TID) | ORAL | 0 refills | Status: AC | PRN

## 2019-07-03 MED ORDER — IBUPROFEN 600 MG PO TABS: 600.0000 mg | ORAL_TABLET | Freq: Three times a day (TID) | ORAL | 0 refills | Status: AC | PRN
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
## 2019-12-29 IMAGING — CT CT HEAD W/O CM
3 series · 16 of 47 positions shown, 19 images · non-contrast
Comparison: January 05, 2019

CLINICAL DATA: Posttraumatic headache

EXAM:
CT HEAD WITHOUT CONTRAST
TECHNIQUE: Contiguous axial images were obtained from the base of the skull
through the vertex without intravenous contrast.

[Series 2: head wo · axial · 0.42mm/px · z∈[-147,-22]mm · 10 of 30 slices shown, 13 images]
[im 3/30  brain]
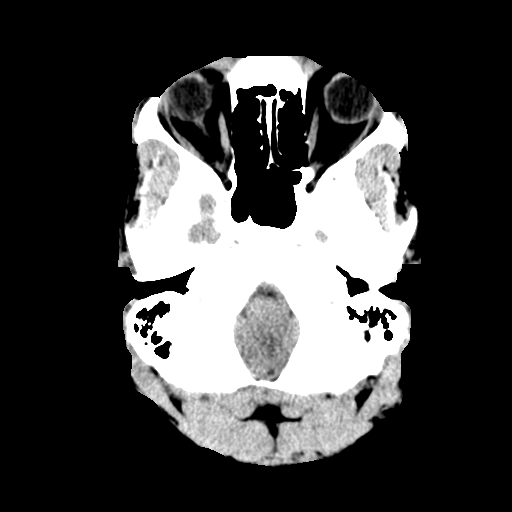
[im 3/30  bone]
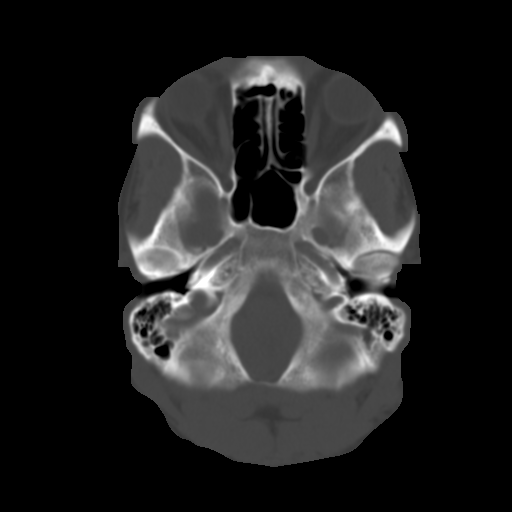
[im 6/30  brain]
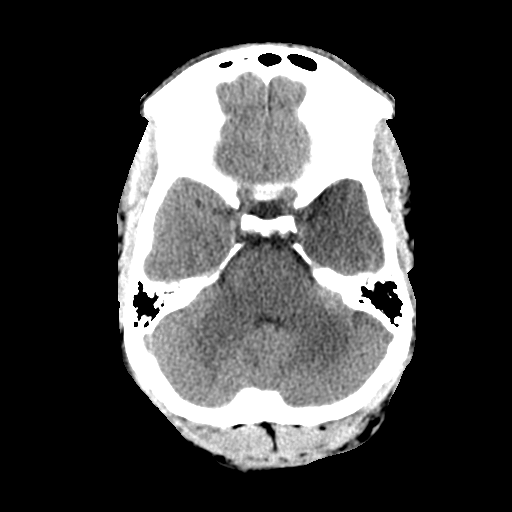
[im 9/30  brain]
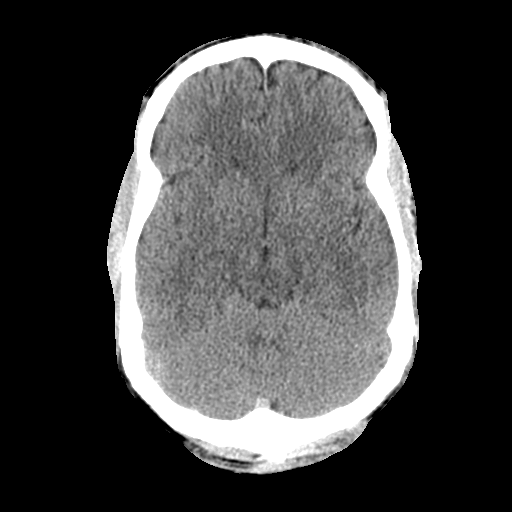
[im 11/30  brain]
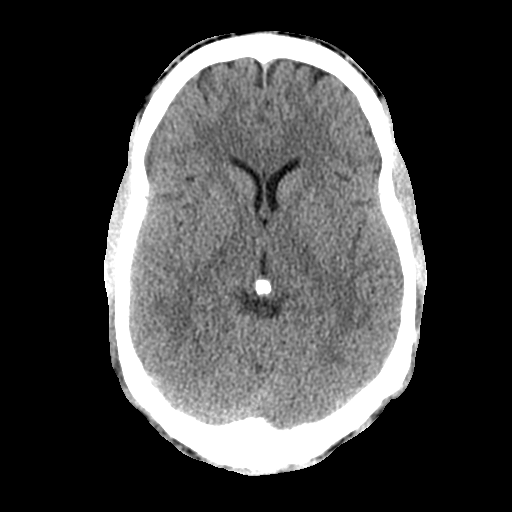
[im 14/30  brain]
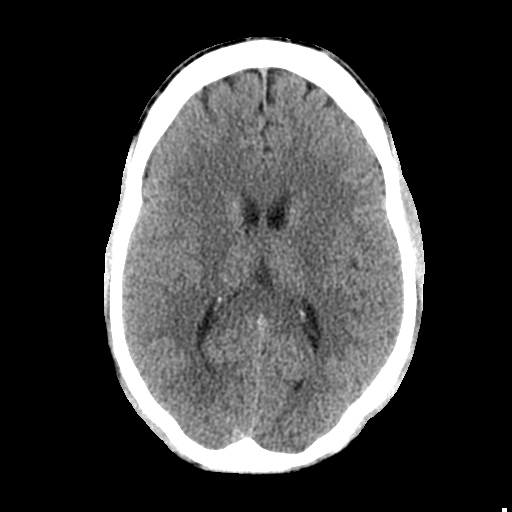
[im 14/30  bone]
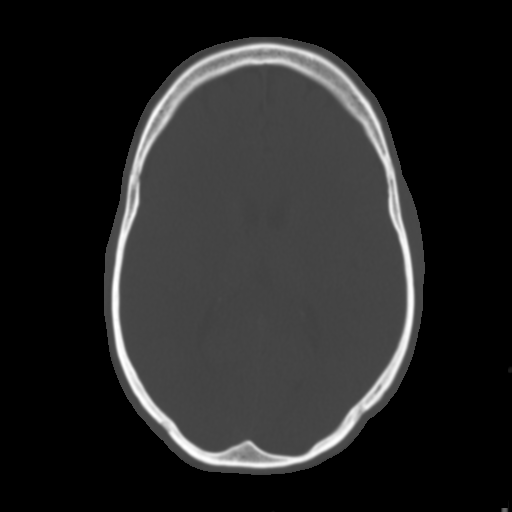
[im 17/30  brain]
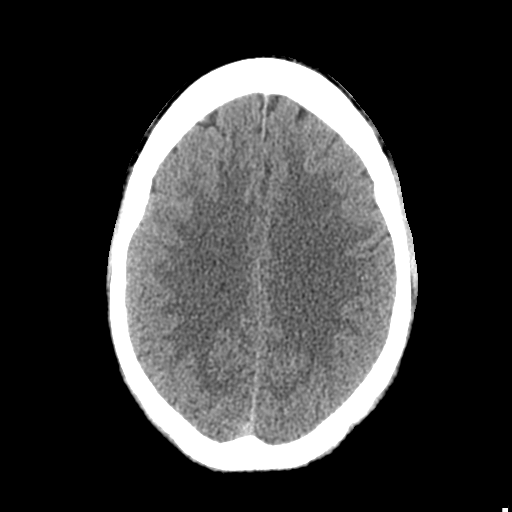
[im 20/30  brain]
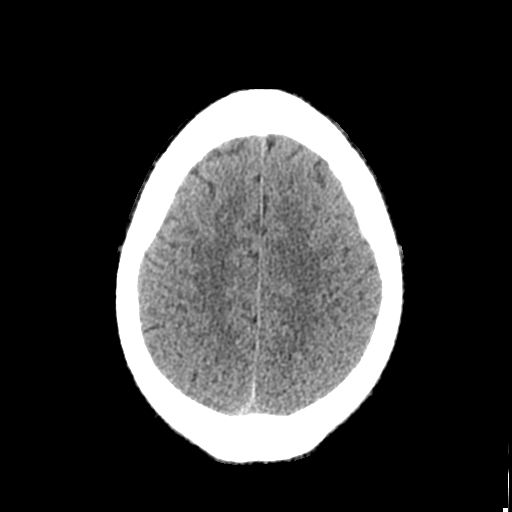
[im 23/30  brain]
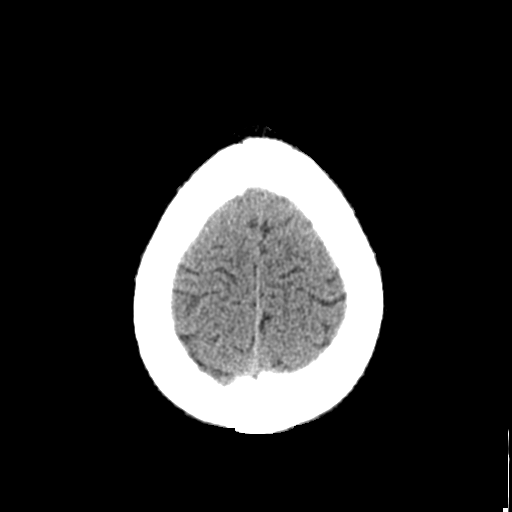
[im 25/30  brain]
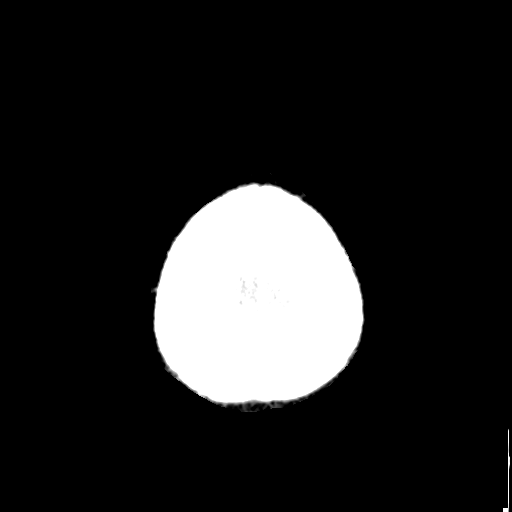
[im 25/30  bone]
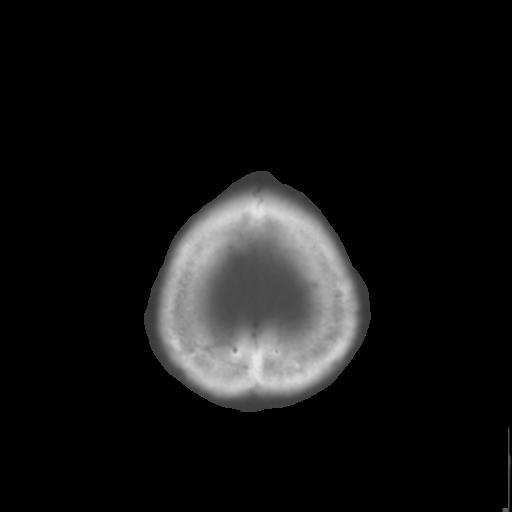
[im 28/30  brain]
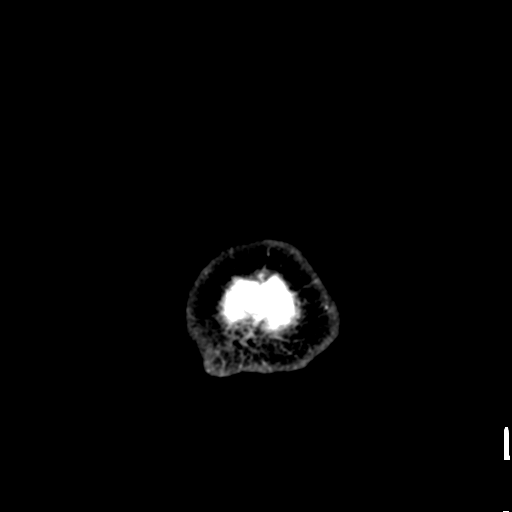

[Series 4: coronal soft tissue · coronal · 0.30mm/px · 3 of 69 slices shown]
[im 23/69  brain]
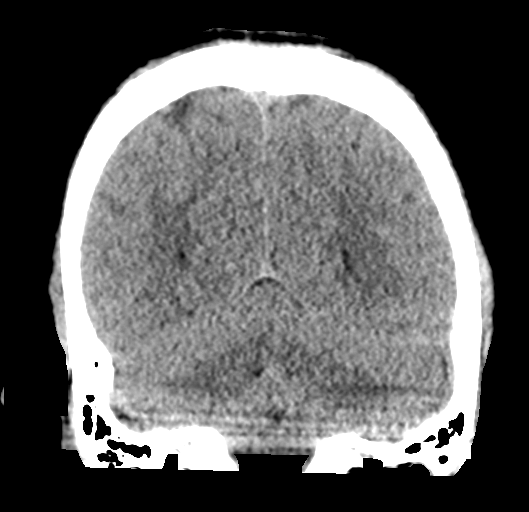
[im 31/69  brain]
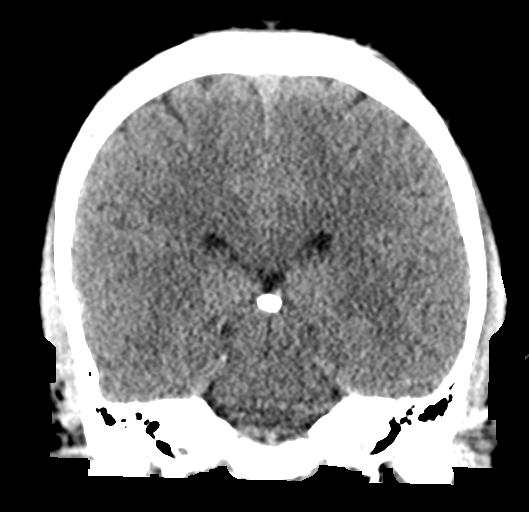
[im 38/69  brain]
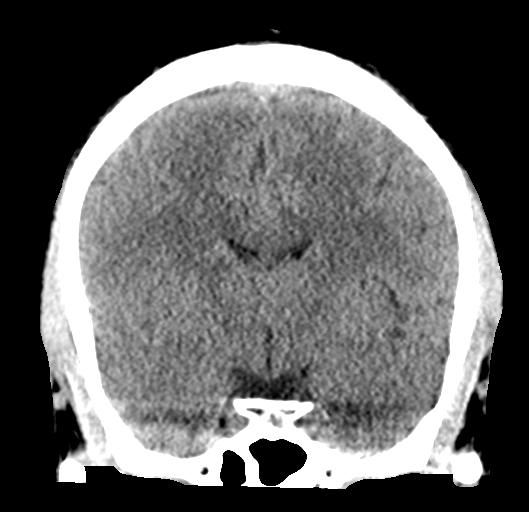

[Series 5: sagittal soft tissue · sagittal · 0.30mm/px · 3 of 54 slices shown]
[im 18/54  brain]
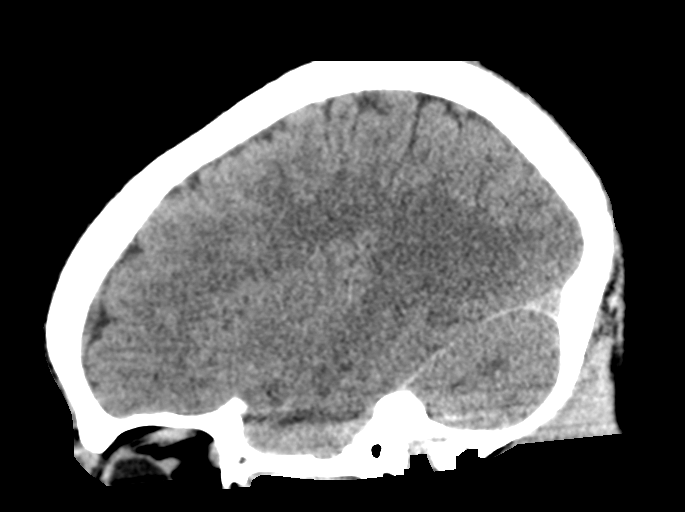
[im 27/54  brain]
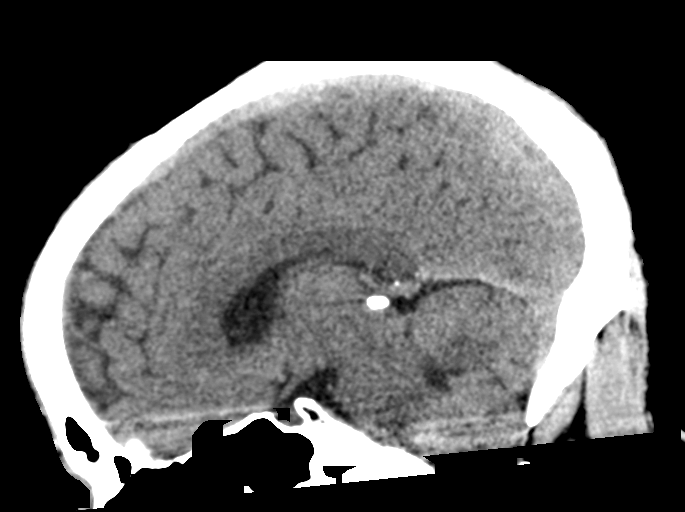
[im 36/54  brain]
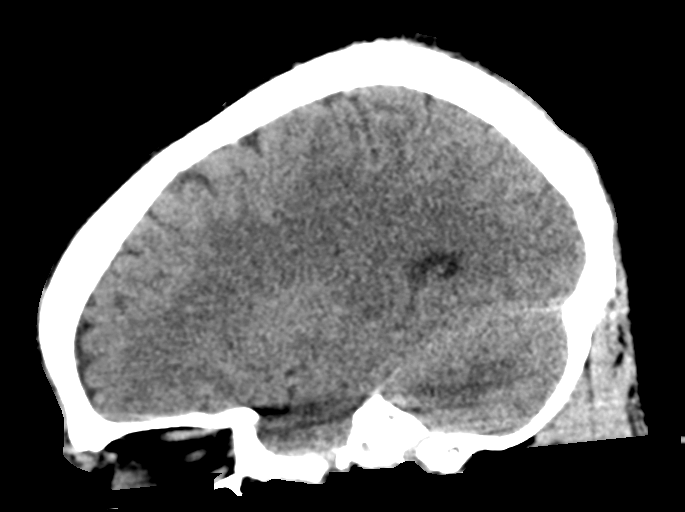

[16 of 47 positions shown; findings below may reference images not displayed]

FINDINGS: Brain: There is no acute intracranial hemorrhage, mass-effect, or
edema. Gray-white differentiation is preserved. There is no
extra-axial fluid collection. Ventricles and sulci are within normal
limits in size and configuration.

Vascular: No hyperdense vessel or unexpected calcification.

Skull: Calvarium is unremarkable.

Sinuses/Orbits: No acute finding.

Other: None.
IMPRESSION: No evidence of acute intracranial injury.

## 2020-02-14 ENCOUNTER — Telehealth: Payer: Self-pay | Admitting: General Practice

## 2020-02-14 NOTE — Telephone Encounter (Signed)
ER referral patient but the number has been disconnected and therefore no more contact will be attempted in regards to becoming an [facility] patient

## 2020-09-09 ENCOUNTER — Other Ambulatory Visit: Payer: Self-pay

## 2020-09-09 DIAGNOSIS — Z5321 Procedure and treatment not carried out due to patient leaving prior to being seen by health care provider: Secondary | ICD-10-CM | POA: Insufficient documentation

## 2020-09-09 DIAGNOSIS — R109 Unspecified abdominal pain: Secondary | ICD-10-CM | POA: Insufficient documentation

## 2020-09-09 LAB — CBC
HCT: 39.4 % (ref 39.0–52.0)
Hemoglobin: 13.2 g/dL (ref 13.0–17.0)
MCH: 28.6 pg (ref 26.0–34.0)
MCHC: 33.5 g/dL (ref 30.0–36.0)
MCV: 85.3 fL (ref 80.0–100.0)
Platelets: 155 10*3/uL (ref 150–400)
RBC: 4.62 MIL/uL (ref 4.22–5.81)
RDW: 15 % (ref 11.5–15.5)
WBC: 13.4 10*3/uL — ABNORMAL HIGH (ref 4.0–10.5)
nRBC: 0 % (ref 0.0–0.2)

## 2020-09-09 LAB — URINALYSIS, COMPLETE (UACMP) WITH MICROSCOPIC
Bacteria, UA: NONE SEEN
Bilirubin Urine: NEGATIVE
Glucose, UA: NEGATIVE mg/dL
Ketones, ur: NEGATIVE mg/dL
Leukocytes,Ua: NEGATIVE
Nitrite: NEGATIVE
Protein, ur: NEGATIVE mg/dL
RBC / HPF: >50 RBC/hpf — ABNORMAL HIGH (ref 0–5)
Specific Gravity, Urine: 1.024 (ref 1.005–1.030)
pH: 6 (ref 5.0–8.0)

## 2020-09-09 LAB — COMPREHENSIVE METABOLIC PANEL
ALT: 22 U/L (ref 0–44)
AST: 27 U/L (ref 15–41)
Albumin: 3.9 g/dL (ref 3.5–5.0)
Alkaline Phosphatase: 65 U/L (ref 38–126)
Anion gap: 11 (ref 5–15)
BUN: 13 mg/dL (ref 6–20)
CO2: 25 mmol/L (ref 22–32)
Calcium: 9.6 mg/dL (ref 8.9–10.3)
Chloride: 107 mmol/L (ref 98–111)
Creatinine, Ser: 1.2 mg/dL (ref 0.61–1.24)
GFR, Estimated: >60 mL/min (ref >60)
Glucose, Bld: 111 mg/dL — ABNORMAL HIGH (ref 70–99)
Potassium: 3.6 mmol/L (ref 3.5–5.1)
Sodium: 143 mmol/L (ref 135–145)
Total Bilirubin: 0.9 mg/dL (ref 0.3–1.2)
Total Protein: 6.8 g/dL (ref 6.5–8.1)

## 2020-09-09 LAB — LIPASE, BLOOD: Lipase: 27 U/L (ref 11–51)

## 2020-09-09 NOTE — ED Triage Notes (Addendum)
Pt in with co right sided abd pain that started this am, states no n.v.d. at this time. Pt has not had BM in 2-3 days,

## 2020-09-10 ENCOUNTER — Emergency Department: Payer: No Typology Code available for payment source

## 2020-09-10 ENCOUNTER — Emergency Department
Admission: EM | Admit: 2020-09-10 | Discharge: 2020-09-10 | Disposition: A | Discharge status: Home / Self Care | Payer: No Typology Code available for payment source | Attending: Emergency Medicine | Admitting: Emergency Medicine

## 2021-01-14 ENCOUNTER — Emergency Department
Admission: EM | Admit: 2021-01-14 | Discharge: 2021-01-14 | Disposition: A | Discharge status: Home / Self Care | Payer: Self-pay | Attending: Emergency Medicine | Admitting: Emergency Medicine

## 2021-01-14 ENCOUNTER — Other Ambulatory Visit: Payer: Self-pay

## 2021-01-14 ENCOUNTER — Encounter: Payer: Self-pay | Admitting: Emergency Medicine

## 2021-01-14 DIAGNOSIS — Z5321 Procedure and treatment not carried out due to patient leaving prior to being seen by health care provider: Secondary | ICD-10-CM | POA: Insufficient documentation

## 2021-01-14 DIAGNOSIS — K0889 Other specified disorders of teeth and supporting structures: Secondary | ICD-10-CM | POA: Insufficient documentation

## 2021-01-14 NOTE — ED Notes (Signed)
Called to get back to a treatment room, no answer. Left HIPPA compliant message.

## 2021-01-14 NOTE — ED Notes (Addendum)
No answer when called several times from lobby, no answer when phone # listed in chart called 

## 2021-01-14 NOTE — ED Notes (Signed)
No answer when called several times from lobby 

## 2021-01-14 NOTE — ED Triage Notes (Addendum)
Patient ambulatory to triage with steady gait, without difficulty or distress noted; pt reports rt sided dental pain that began tonight unrelieved by ibuprofen; st has broken teeth, appt with dentist later this morning

## 2021-03-08 IMAGING — CR DG ABDOMEN 1V
1 series · 1 of 1 positions shown · non-contrast
Comparison: Acute abdominal series 04/13/2013, CT 01/05/2019

CLINICAL DATA: Right-sided abdominal pain, no bowel movement for
2-3 days

EXAM:
ABDOMEN - 1 VIEW

[dg abd 1 view]
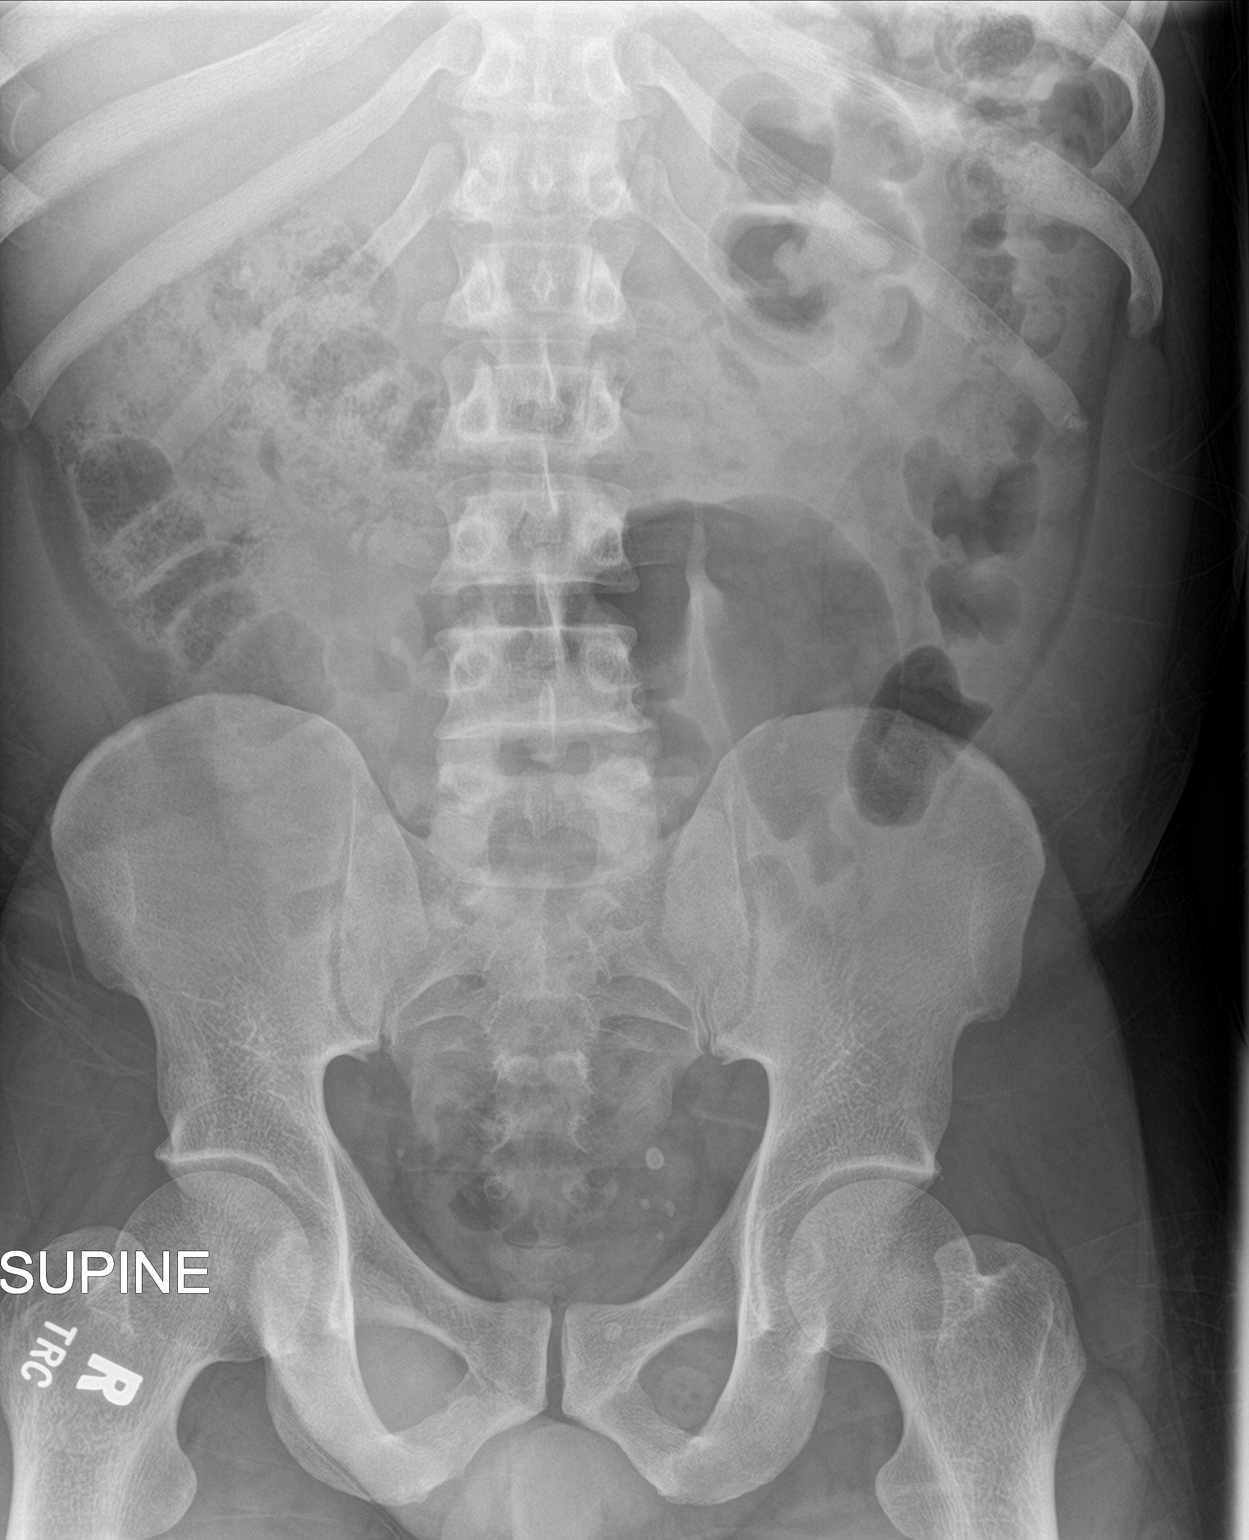

[1 of 1 positions shown; findings below may reference images not displayed]

FINDINGS: No high-grade obstructive bowel gas pattern is seen. Moderate
colonic stool burden predominantly in the right colon and hepatic
flexure. Air and stool is seen over the rectal vault. No suspicious
abdominal calcifications. Benign-appearing phleboliths noted in the
deep pelvis. No acute or worrisome osseous abnormalities. Remote
appearing right L3 transverse process fracture.
IMPRESSION: Moderate colonic stool burden predominantly in the right colon and
hepatic flexure. Correlate for features of constipation.

No other acute abdominal abnormalities.

## 2022-08-06 ENCOUNTER — Other Ambulatory Visit: Payer: Self-pay

## 2022-08-06 ENCOUNTER — Emergency Department: Payer: Self-pay

## 2022-08-06 ENCOUNTER — Emergency Department
Admission: EM | Admit: 2022-08-06 | Discharge: 2022-08-06 | Discharge status: Against Medical Advice | Payer: Self-pay | Attending: Emergency Medicine | Admitting: Emergency Medicine

## 2022-08-06 DIAGNOSIS — Z1152 Encounter for screening for COVID-19: Secondary | ICD-10-CM | POA: Insufficient documentation

## 2022-08-06 DIAGNOSIS — R0602 Shortness of breath: Secondary | ICD-10-CM | POA: Insufficient documentation

## 2022-08-06 DIAGNOSIS — Z5321 Procedure and treatment not carried out due to patient leaving prior to being seen by health care provider: Secondary | ICD-10-CM | POA: Insufficient documentation

## 2022-08-06 DIAGNOSIS — R509 Fever, unspecified: Secondary | ICD-10-CM | POA: Insufficient documentation

## 2022-08-06 DIAGNOSIS — R059 Cough, unspecified: Secondary | ICD-10-CM | POA: Insufficient documentation

## 2022-08-06 LAB — COMPREHENSIVE METABOLIC PANEL
ALT: 37 U/L (ref 0–44)
AST: 50 U/L — ABNORMAL HIGH (ref 15–41)
Albumin: 4.2 g/dL (ref 3.5–5.0)
Alkaline Phosphatase: 58 U/L (ref 38–126)
Anion gap: 11 (ref 5–15)
BUN: 17 mg/dL (ref 6–20)
CO2: 21 mmol/L — ABNORMAL LOW (ref 22–32)
Calcium: 9.5 mg/dL (ref 8.9–10.3)
Chloride: 109 mmol/L (ref 98–111)
Creatinine, Ser: 0.89 mg/dL (ref 0.61–1.24)
GFR, Estimated: >60 mL/min (ref >60)
Glucose, Bld: 108 mg/dL — ABNORMAL HIGH (ref 70–99)
Potassium: 3.5 mmol/L (ref 3.5–5.1)
Sodium: 141 mmol/L (ref 135–145)
Total Bilirubin: 0.8 mg/dL (ref 0.3–1.2)
Total Protein: 7.6 g/dL (ref 6.5–8.1)

## 2022-08-06 LAB — CBC WITH DIFFERENTIAL/PLATELET
Abs Immature Granulocytes: 0.04 10*3/uL (ref 0.00–0.07)
Basophils Absolute: 0.1 10*3/uL (ref 0.0–0.1)
Basophils Relative: 1 %
Eosinophils Absolute: 0.1 10*3/uL (ref 0.0–0.5)
Eosinophils Relative: 1 %
HCT: 46.2 % (ref 39.0–52.0)
Hemoglobin: 15.2 g/dL (ref 13.0–17.0)
Immature Granulocytes: 0 %
Lymphocytes Relative: 13 %
Lymphs Abs: 1.6 10*3/uL (ref 0.7–4.0)
MCH: 27.9 pg (ref 26.0–34.0)
MCHC: 32.9 g/dL (ref 30.0–36.0)
MCV: 84.8 fL (ref 80.0–100.0)
Monocytes Absolute: 0.7 10*3/uL (ref 0.1–1.0)
Monocytes Relative: 6 %
Neutro Abs: 10.1 10*3/uL — ABNORMAL HIGH (ref 1.7–7.7)
Neutrophils Relative %: 79 %
Platelets: 181 10*3/uL (ref 150–400)
RBC: 5.45 MIL/uL (ref 4.22–5.81)
RDW: 14.6 % (ref 11.5–15.5)
WBC: 12.6 10*3/uL — ABNORMAL HIGH (ref 4.0–10.5)
nRBC: 0 % (ref 0.0–0.2)

## 2022-08-06 LAB — TROPONIN I (HIGH SENSITIVITY): Troponin I (High Sensitivity): 12 ng/L (ref <18)

## 2022-08-06 LAB — RESP PANEL BY RT-PCR (RSV, FLU A&B, COVID)  RVPGX2
Influenza A by PCR: POSITIVE — AB
Influenza B by PCR: NEGATIVE
Resp Syncytial Virus by PCR: NEGATIVE
SARS Coronavirus 2 by RT PCR: NEGATIVE

## 2022-08-06 NOTE — ED Provider Triage Note (Signed)
Emergency Medicine Provider Triage Evaluation Note  Bobby Walker , a 46 y.o. male  was evaluated in triage.  Pt complains of cough fever sob.  Review of Systems  Positive:  Negative:   Physical Exam  BP 123/78 (BP Location: Left Arm)   Pulse 66   Temp 99.1 F (37.3 C) (Oral)   Resp (!) 22   Ht 5\' 1"  (1.549 m)   Wt 64.9 kg   SpO2 96%   BMI 27.02 kg/m  Gen:   Awake, no distress   Resp:  Normal effort  MSK:   Moves extremities without difficulty  Other:    Medical Decision Making  Medically screening exam initiated at 8:24 PM.  Appropriate orders placed.  Bobby Walker was informed that the remainder of the evaluation will be completed by another provider, this initial triage assessment does not replace that evaluation, and the importance of remaining in the ED until their evaluation is complete.     Mechele Collin, PA-C 08/06/22 2024

## 2022-08-06 NOTE — ED Triage Notes (Signed)
Weakness with SOB. Reports cough, congestion, n/v, body aches. Reports symptoms onset yesterday. Pt reports subjective fevers as well. Pt alert and oriented following commands. DOE noted but pt speaking in full sentences with symmetric chest rise and fall. Pt reports diaphoresis and chills as well.

## 2022-11-23 ENCOUNTER — Emergency Department
Admission: EM | Admit: 2022-11-23 | Discharge: 2022-11-24 | Disposition: A | Discharge status: Home / Self Care | Payer: Self-pay | Attending: Emergency Medicine | Admitting: Emergency Medicine

## 2022-11-23 ENCOUNTER — Emergency Department: Payer: Self-pay

## 2022-11-23 DIAGNOSIS — R531 Weakness: Secondary | ICD-10-CM | POA: Insufficient documentation

## 2022-11-23 DIAGNOSIS — R55 Syncope and collapse: Secondary | ICD-10-CM | POA: Insufficient documentation

## 2022-11-23 DIAGNOSIS — Z1152 Encounter for screening for COVID-19: Secondary | ICD-10-CM | POA: Insufficient documentation

## 2022-11-23 LAB — CBC
HCT: 42.6 % (ref 39.0–52.0)
Hemoglobin: 14 g/dL (ref 13.0–17.0)
MCH: 28.6 pg (ref 26.0–34.0)
MCHC: 32.9 g/dL (ref 30.0–36.0)
MCV: 86.9 fL (ref 80.0–100.0)
Platelets: 266 10*3/uL (ref 150–400)
RBC: 4.9 MIL/uL (ref 4.22–5.81)
RDW: 14.7 % (ref 11.5–15.5)
WBC: 14.3 10*3/uL — ABNORMAL HIGH (ref 4.0–10.5)
nRBC: 0 % (ref 0.0–0.2)

## 2022-11-23 MED ORDER — SODIUM CHLORIDE 0.9 % IV BOLUS
1000.0000 mL | Freq: Once | INTRAVENOUS | Status: AC
  Administered 2022-11-23: 1000 mL via INTRAVENOUS

## 2022-11-23 NOTE — ED Provider Notes (Signed)
[facility] Provider Note    Event Date/Time   First MD Initiated Contact with Patient 11/23/22 2303     (approximate)  History   Chief Complaint: Near Syncope  HPI  Bobby Walker is a 47 y.o. male with no significant past medical history who presents to the emergency department after syncopal episode.  According to report patient noted to be lethargic acting, admitted to using marijuana, had a syncopal episode.  Here the patient is somnolent but will awaken to voice will answer questions appropriately but then falls back asleep if not being actively engaged.  Patient states he has been coughing recently denies any known fever denies any vomiting diarrhea chest pain or abdominal pain.  Patient denies any other substances besides marijuana.  Patient states he has passed out before but cannot recall how long it has been.  Noted to have a borderline low-grade temperature of 100.0.  Physical Exam   Triage Vital Signs: ED Triage Vitals  Enc Vitals Group     BP 11/23/22 2243 138/86     Pulse Rate 11/23/22 2243 (!) 58     Resp 11/23/22 2243 12     Temp 11/23/22 2243 100 F (37.8 C)     Temp Source 11/23/22 2243 Oral     SpO2 11/23/22 2243 95 %     Weight 11/23/22 2246 160 lb (72.6 kg)     Height 11/23/22 2246 5\' 2"  (1.575 m)     Head Circumference --      Peak Flow --      Pain Score 11/23/22 2245 0     Pain Loc --      Pain Edu? --      Excl. in GC? --     Most recent vital signs: Vitals:   11/23/22 2243  BP: 138/86  Pulse: (!) 58  Resp: 12  Temp: 100 F (37.8 C)  SpO2: 95%    General: Awake, no distress.  CV:  Good peripheral perfusion.  Regular rate and rhythm  Resp:  Normal effort.  Equal breath sounds bilaterally.  Abd:  No distention.  Soft, nontender.  No rebound or guarding.  ED Results / Procedures / Treatments   EKG  EKG viewed and interpreted by myself shows a sinus rhythm at 58 bpm with a narrow QRS, normal axis, normal  intervals, nonspecific ST changes.  No ST elevation.  RADIOLOGY  I have reviewed and interpreted the chest x-ray images.  Low volume but I do not see any obvious consolidation. Radiology has read no active disease.   MEDICATIONS ORDERED IN ED: Medications  sodium chloride 0.9 % bolus 1,000 mL (has no administration in time range)     IMPRESSION / MDM / ASSESSMENT AND PLAN / ED COURSE  I reviewed the triage vital signs and the nursing notes.  Patient's presentation is most consistent with acute presentation with potential threat to life or bodily function.  Patient presents emergency department after syncopal episode as well as fatigue/weakness.  Here the patient noted to be somnolent but will awaken to voice will answer questions appropriately but falls back asleep if not being actively engaged.  Patient does admit to marijuana products tonight but denies any other substances.  Borderline low-grade temperature and he admits to a cough we will check for COVID/flu obtain a chest x-ray.  We will check labs, urinalysis and IV hydrate while awaiting results.  Patient agreeable to plan of care.  Patient's workup shows a clear  chest x-ray, negative ethanol, negative COVID/flu/RSV.  Reassuringly negative troponin.  Chemistry is normal mild leukocytosis on the CBC but otherwise no significant findings.  Patient states he is ready to go home.  States he does not feel like he can give a urine sample and does not want to wait for this.  Patient states he is calling a friend who will come pick him up.  Given the patient's overall reassuring workup I believe the patient would be safe for discharge home with PCP follow-up.  Patient agreeable to plan.  FINAL CLINICAL IMPRESSION(S) / ED DIAGNOSES   Weakness Syncope   Note:  This document was prepared using Dragon voice recognition software and may include unintentional dictation errors.   Minna Antis, MD 11/24/22 [phone]

## 2022-11-23 NOTE — ED Notes (Signed)
Pt unable to void at thistime   iv fluids infusing.

## 2022-11-23 NOTE — ED Triage Notes (Signed)
Pt to room 6 via ems w c/o passing out. Family called ems, pt was on front porch waiting. Ems reports pt was lethargic and admitted to working outside today, not drinking many fluids and smoking marijuana. Pt A&Ox4 on arrival.

## 2022-11-24 LAB — RESP PANEL BY RT-PCR (RSV, FLU A&B, COVID)  RVPGX2
Influenza A by PCR: NEGATIVE
Influenza B by PCR: NEGATIVE
Resp Syncytial Virus by PCR: NEGATIVE
SARS Coronavirus 2 by RT PCR: NEGATIVE

## 2022-11-24 LAB — COMPREHENSIVE METABOLIC PANEL
ALT: 19 U/L (ref 0–44)
AST: 26 U/L (ref 15–41)
Albumin: 4 g/dL (ref 3.5–5.0)
Alkaline Phosphatase: 77 U/L (ref 38–126)
Anion gap: 7 (ref 5–15)
BUN: 10 mg/dL (ref 6–20)
CO2: 27 mmol/L (ref 22–32)
Calcium: 9 mg/dL (ref 8.9–10.3)
Chloride: 106 mmol/L (ref 98–111)
Creatinine, Ser: 0.89 mg/dL (ref 0.61–1.24)
GFR, Estimated: >60 mL/min (ref >60)
Glucose, Bld: 143 mg/dL — ABNORMAL HIGH (ref 70–99)
Potassium: 3.6 mmol/L (ref 3.5–5.1)
Sodium: 140 mmol/L (ref 135–145)
Total Bilirubin: 0.8 mg/dL (ref 0.3–1.2)
Total Protein: 7.2 g/dL (ref 6.5–8.1)

## 2022-11-24 LAB — TROPONIN I (HIGH SENSITIVITY): Troponin I (High Sensitivity): 7 ng/L (ref <18)

## 2022-11-24 LAB — ETHANOL: Alcohol, Ethyl (B): <10 mg/dL (ref <10)

## 2022-11-24 NOTE — Discharge Instructions (Signed)
Please drink plenty of fluids and follow-up with your doctor within the next 2 days for recheck/reevaluation.  Return to the emergency department immediately for any chest pain, trouble breathing, any further episodes of passing out or any other symptom personally concerning to yourself.

## 2022-11-24 NOTE — ED Notes (Signed)
Per dr Lenard Lance.  No repeat troponin needed .   Pt is being discharged.  Pt still unable to void.  Md aware.

## 2023-03-21 ENCOUNTER — Emergency Department
Admission: EM | Admit: 2023-03-21 | Discharge: 2023-03-22 | Disposition: A | Discharge status: Home / Self Care | Payer: Self-pay | Attending: Emergency Medicine | Admitting: Emergency Medicine

## 2023-03-21 ENCOUNTER — Encounter: Payer: Self-pay | Admitting: Emergency Medicine

## 2023-03-21 ENCOUNTER — Other Ambulatory Visit: Payer: Self-pay

## 2023-03-21 ENCOUNTER — Emergency Department: Payer: Self-pay

## 2023-03-21 DIAGNOSIS — R55 Syncope and collapse: Secondary | ICD-10-CM | POA: Insufficient documentation

## 2023-03-21 DIAGNOSIS — D72829 Elevated white blood cell count, unspecified: Secondary | ICD-10-CM | POA: Insufficient documentation

## 2023-03-21 DIAGNOSIS — N179 Acute kidney failure, unspecified: Secondary | ICD-10-CM | POA: Insufficient documentation

## 2023-03-21 DIAGNOSIS — E860 Dehydration: Secondary | ICD-10-CM | POA: Insufficient documentation

## 2023-03-21 LAB — COMPREHENSIVE METABOLIC PANEL
ALT: 25 U/L (ref 0–44)
AST: 36 U/L (ref 15–41)
Albumin: 5.1 g/dL — ABNORMAL HIGH (ref 3.5–5.0)
Alkaline Phosphatase: 76 U/L (ref 38–126)
Anion gap: 14 (ref 5–15)
BUN: 22 mg/dL — ABNORMAL HIGH (ref 6–20)
CO2: 24 mmol/L (ref 22–32)
Calcium: 10.2 mg/dL (ref 8.9–10.3)
Chloride: 101 mmol/L (ref 98–111)
Creatinine, Ser: 1.53 mg/dL — ABNORMAL HIGH (ref 0.61–1.24)
GFR, Estimated: 56 mL/min — ABNORMAL LOW (ref >60)
Glucose, Bld: 98 mg/dL (ref 70–99)
Potassium: 3.4 mmol/L — ABNORMAL LOW (ref 3.5–5.1)
Sodium: 139 mmol/L (ref 135–145)
Total Bilirubin: 1.5 mg/dL — ABNORMAL HIGH (ref 0.3–1.2)
Total Protein: 8.6 g/dL — ABNORMAL HIGH (ref 6.5–8.1)

## 2023-03-21 LAB — TROPONIN I (HIGH SENSITIVITY): Troponin I (High Sensitivity): 10 ng/L (ref <18)

## 2023-03-21 MED ORDER — SODIUM CHLORIDE 0.9 % IV BOLUS
1000.0000 mL | Freq: Once | INTRAVENOUS | Status: AC
  Administered 2023-03-21: 1000 mL via INTRAVENOUS

## 2023-03-21 MED ORDER — ONDANSETRON HCL 4 MG/2ML IJ SOLN
4.0000 mg | Freq: Once | INTRAMUSCULAR | Status: AC
  Administered 2023-03-21: 4 mg via INTRAVENOUS
  Filled 2023-03-21: qty 2

## 2023-03-21 NOTE — ED Provider Notes (Signed)
-----------------------------------------   11:38 PM on 03/21/2023 -----------------------------------------  Assuming care from Dr. Rosalia Hammers.  In short, Bobby Walker is a 47 y.o. male with a chief complaint of dizziness, likely due to dehydration.  Refer to the original H&P for additional details.  The current plan of care is to reassess after fluids.   Clinical Course as of 03/22/23 0056  Mon Mar 22, 2023  4098 I checked on the patient and he has been sleeping comfortably.  He finished his fluids.  He said he feels better and is ready to go.  I offered him a work note for tomorrow but he declined and said he will be going on in the morning.  I gave my usual and customary follow-up recommendations and return precautions. [CF]    Clinical Course User Index [CF] Loleta Rose, MD     Medications  sodium chloride 0.9 % bolus 1,000 mL (1,000 mLs Intravenous New Bag/Given 03/21/23 2335)  ondansetron (ZOFRAN) injection 4 mg (4 mg Intravenous Given 03/21/23 2337)     ED Discharge Orders          Ordered    Ambulatory Referral to Primary Care (Establish Care)        03/22/23 0055           Final diagnoses:  Syncope and collapse  Dehydration  AKI (acute kidney injury) (HCC)     Loleta Rose, MD 03/22/23 [phone]

## 2023-03-21 NOTE — ED Triage Notes (Signed)
Pt BIB EMS from home, EMS was called out for choking. Pt states that he went to lay down because he felt dizzy, got up still felt dizzy, threw up on the porch and felt like he was choking. Fell on his porch, c/o left rb pain.   VS 94/60 initially with EMS

## 2023-03-21 NOTE — ED Provider Notes (Signed)
[facility] Provider Note    Event Date/Time   First MD Initiated Contact with Patient 03/21/23 2315     (approximate)   History   Dizziness   HPI  Bobby Walker is a 47 y.o. male presenting to the emergency department for evaluation of lightheadedness.  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  While he was at work, he had an episode of lightheadedness, but was able to sit down with improvement in his symptoms.  When he was home later this evening, had recurrent lightheadedness and went to lay down.  When he stood up after laying down he had recurrent lightheadedness and had a syncopal episode and fell onto his left side.  Denies headache, neck pain.  Does report some pain over his ribs.  Not on anticoagulation.  Denies chest pain, shortness of breath.     Physical Exam   Triage Vital Signs: ED Triage Vitals [03/21/23 2124]  Encounter Vitals Group     BP 114/72     Systolic BP Percentile      Diastolic BP Percentile      Pulse Rate 67     Resp 18     Temp 97.6 F (36.4 C)     Temp src      SpO2 100 %     Weight 160 lb 0.9 oz (72.6 kg)     Height 5\' 2"  (1.575 m)     Head Circumference      Peak Flow      Pain Score 7     Pain Loc      Pain Education      Exclude from Growth Chart     Most recent vital signs: Vitals:   03/21/23 2124 03/21/23 2130  BP: 114/72 124/80  Pulse: 67 69  Resp: 18 17  Temp: 97.6 F (36.4 C)   SpO2: 100% 100%     General: Awake, interactive  CV:  Regular rate, good peripheral perfusion. Chest wall: Tenderness to palpation over the left lateral ribs. Resp:  Lungs clear, unlabored respirations.  Abd:  Soft, nondistended, nontender to palpation Neuro:  Symmetric facial movement, fluid speech, 5 out of 5 strength in the bilateral upper and lower extremities with normal sensation, normal finger-to-nose testing   ED Results / Procedures / Treatments   Labs (all labs ordered  are listed, but only abnormal results are displayed) Labs Reviewed  COMPREHENSIVE METABOLIC PANEL - Abnormal; Notable for the following components:      Result Value   Potassium 3.4 (*)    BUN 22 (*)    Creatinine, Ser 1.53 (*)    Total Protein 8.6 (*)    Albumin 5.1 (*)    Total Bilirubin 1.5 (*)    GFR, Estimated 56 (*)    All other components within normal limits  CBC WITH DIFFERENTIAL/PLATELET - Abnormal; Notable for the following components:   WBC 14.5 (*)    All other components within normal limits  TROPONIN I (HIGH SENSITIVITY)     EKG EKG independently reviewed interpreted by myself (ER attending) demonstrates:  EKG demonstrates sinus rhythm at a rate of 72, PR 139, QRS 80, QTc 425, slight ST elevation in the anterior leads, more prominent than prior EKG from 11/23/2022  RADIOLOGY Imaging independently reviewed and interpreted by myself demonstrates:  Rib x-Milli [place] without evidence of pneumothorax or rib fracture  PROCEDURES:  Critical Care performed: No  Procedures   MEDICATIONS ORDERED  IN ED: Medications  sodium chloride 0.9 % bolus 1,000 mL (1,000 mLs Intravenous New Bag/Given 03/21/23 2335)  ondansetron (ZOFRAN) injection 4 mg (4 mg Intravenous Given 03/21/23 2337)     IMPRESSION / MDM / ASSESSMENT AND PLAN / ED COURSE  I reviewed the triage vital signs and the nursing notes.  Differential diagnosis includes, but is not limited to, syncope due to dehydration, anemia, electrolyte abnormality, arrhythmia, lower suspicion ACS in the absence of chest pain  Patient's presentation is most consistent with acute presentation with potential threat to life or bodily function.  72 male presenting following a syncopal episode.  Clinical history seems most consistent with vasovagal syncope.  Will obtain labs including troponin, rib x-Camar [place], and treat symptomatically with IV fluids, Zofran.  Lab work does demonstrate leukocytosis at 14.5, does have an AKI with creatinine 1.53,  baseline appears to be around 0.8-1.  Troponin negative.  Will treat symptomatically with IV fluids and Zofran.  If patient is tolerating p.o. intake following this and feels improved, may be able to be discharged.     FINAL CLINICAL IMPRESSION(S) / ED DIAGNOSES   Final diagnoses:  Syncope and collapse     Rx / DC Orders   ED Discharge Orders     None        Note:  This document was prepared using Dragon voice recognition software and may include unintentional dictation errors.   Trinna Post, MD 03/22/23 [phone]

## 2023-03-22 NOTE — Discharge Instructions (Addendum)
